# Patient Record
Sex: Female | Born: 1955 | Race: Black or African American | Hispanic: No | Marital: Single | Smoking: Current some day smoker
Health system: Southern US, Community
[De-identification: ages and names within clinical notes are randomized; demographics above are authoritative.]

## PROBLEM LIST (undated history)

## (undated) DIAGNOSIS — K589 Irritable bowel syndrome without diarrhea: Secondary | ICD-10-CM

## (undated) DIAGNOSIS — M1990 Unspecified osteoarthritis, unspecified site: Secondary | ICD-10-CM

---

## 2008-10-06 ENCOUNTER — Emergency Department (HOSPITAL_COMMUNITY): Admission: EM | Admit: 2008-10-06 | Discharge: 2008-10-06 | Payer: Self-pay | Admitting: Emergency Medicine

## 2014-05-26 ENCOUNTER — Emergency Department: Payer: Self-pay | Admitting: Emergency Medicine

## 2014-09-13 ENCOUNTER — Telehealth: Payer: Self-pay | Admitting: Gastroenterology

## 2014-09-13 NOTE — Telephone Encounter (Signed)
Received referral for IBS called pt to sch. She said her pcp gave her medication to see if it will help. She will call us if it doesn't work to sch.

## 2015-07-19 ENCOUNTER — Ambulatory Visit
Admission: RE | Admit: 2015-07-19 | Discharge: 2015-07-19 | Disposition: A | Discharge status: Home / Self Care | Payer: Disability Insurance | Source: Ambulatory Visit | Attending: Family Medicine | Admitting: Family Medicine

## 2015-07-19 ENCOUNTER — Other Ambulatory Visit: Payer: Self-pay | Admitting: Family Medicine

## 2015-07-19 DIAGNOSIS — M1990 Unspecified osteoarthritis, unspecified site: Secondary | ICD-10-CM

## 2015-07-19 DIAGNOSIS — M2578 Osteophyte, vertebrae: Secondary | ICD-10-CM | POA: Insufficient documentation

## 2015-07-19 DIAGNOSIS — M5136 Other intervertebral disc degeneration, lumbar region: Secondary | ICD-10-CM | POA: Insufficient documentation

## 2015-07-19 DIAGNOSIS — M25761 Osteophyte, right knee: Secondary | ICD-10-CM | POA: Diagnosis not present

## 2015-07-19 DIAGNOSIS — M545 Low back pain: Secondary | ICD-10-CM

## 2015-07-19 DIAGNOSIS — M25762 Osteophyte, left knee: Secondary | ICD-10-CM | POA: Insufficient documentation

## 2015-07-19 DIAGNOSIS — G8929 Other chronic pain: Secondary | ICD-10-CM | POA: Insufficient documentation

## 2015-07-19 DIAGNOSIS — M170 Bilateral primary osteoarthritis of knee: Secondary | ICD-10-CM | POA: Diagnosis not present

## 2016-04-21 ENCOUNTER — Other Ambulatory Visit: Payer: Self-pay | Admitting: Family Medicine

## 2016-04-21 DIAGNOSIS — Z1239 Encounter for other screening for malignant neoplasm of breast: Secondary | ICD-10-CM

## 2016-05-06 ENCOUNTER — Emergency Department
Admission: EM | Admit: 2016-05-06 | Discharge: 2016-05-06 | Disposition: A | Discharge status: Home / Self Care | Payer: Medicaid Other | Attending: Emergency Medicine | Admitting: Emergency Medicine

## 2016-05-06 ENCOUNTER — Encounter: Payer: Self-pay | Admitting: Emergency Medicine

## 2016-05-06 ENCOUNTER — Emergency Department: Payer: Medicaid Other

## 2016-05-06 DIAGNOSIS — R109 Unspecified abdominal pain: Secondary | ICD-10-CM | POA: Diagnosis present

## 2016-05-06 DIAGNOSIS — N390 Urinary tract infection, site not specified: Secondary | ICD-10-CM | POA: Insufficient documentation

## 2016-05-06 DIAGNOSIS — F17200 Nicotine dependence, unspecified, uncomplicated: Secondary | ICD-10-CM | POA: Insufficient documentation

## 2016-05-06 HISTORY — DX: Unspecified osteoarthritis, unspecified site: M19.90

## 2016-05-06 LAB — BASIC METABOLIC PANEL
Anion gap: 9 (ref 5–15)
BUN: 6 mg/dL (ref 6–20)
CHLORIDE: 105 mmol/L (ref 101–111)
CO2: 23 mmol/L (ref 22–32)
CREATININE: 0.88 mg/dL (ref 0.44–1.00)
Calcium: 8.9 mg/dL (ref 8.9–10.3)
GFR calc Af Amer: >60 mL/min (ref >60)
GFR calc non Af Amer: >60 mL/min (ref >60)
GLUCOSE: 119 mg/dL — AB (ref 65–99)
POTASSIUM: 3.8 mmol/L (ref 3.5–5.1)
SODIUM: 137 mmol/L (ref 135–145)

## 2016-05-06 LAB — URINALYSIS, COMPLETE (UACMP) WITH MICROSCOPIC
Bilirubin Urine: NEGATIVE
GLUCOSE, UA: NEGATIVE mg/dL
KETONES UR: NEGATIVE mg/dL
Nitrite: NEGATIVE
Protein, ur: 30 mg/dL — AB
Specific Gravity, Urine: 1.016 (ref 1.005–1.030)
pH: 5 (ref 5.0–8.0)

## 2016-05-06 LAB — CBC
HCT: 39.4 % (ref 35.0–47.0)
HEMOGLOBIN: 13.4 g/dL (ref 12.0–16.0)
MCH: 29.8 pg (ref 26.0–34.0)
MCHC: 34.1 g/dL (ref 32.0–36.0)
MCV: 87.3 fL (ref 80.0–100.0)
Platelets: 182 10*3/uL (ref 150–440)
RBC: 4.52 MIL/uL (ref 3.80–5.20)
RDW: 13.9 % (ref 11.5–14.5)
WBC: 5.1 10*3/uL (ref 3.6–11.0)

## 2016-05-06 MED ORDER — KETOROLAC TROMETHAMINE 60 MG/2ML IM SOLN
60.0000 mg | Freq: Once | INTRAMUSCULAR | Status: AC
  Administered 2016-05-06: 60 mg via INTRAMUSCULAR

## 2016-05-06 MED ORDER — OXYCODONE-ACETAMINOPHEN 5-325 MG PO TABS
1.0000 | ORAL_TABLET | Freq: Once | ORAL | Status: AC
  Administered 2016-05-06: 1 via ORAL

## 2016-05-06 MED ORDER — KETOROLAC TROMETHAMINE 60 MG/2ML IM SOLN
INTRAMUSCULAR | Status: AC
  Administered 2016-05-06: 60 mg via INTRAMUSCULAR
  Filled 2016-05-06: qty 2

## 2016-05-06 MED ORDER — OXYCODONE-ACETAMINOPHEN 5-325 MG PO TABS
ORAL_TABLET | ORAL | Status: AC
  Filled 2016-05-06: qty 1

## 2016-05-06 MED ORDER — CEPHALEXIN 500 MG PO CAPS: 500.0000 mg | ORAL_CAPSULE | Freq: Two times a day (BID) | ORAL | 0 refills | Status: DC

## 2016-05-06 NOTE — ED Provider Notes (Addendum)
[facility] Emergency Department Provider Note  ____________________________________________   First MD Initiated Contact with Patient 05/06/16 [phone]     (approximate)  I have reviewed the triage vital signs and the nursing notes.   HISTORY  Chief Complaint Flank Pain    HPI Alexis Blackwell is a 61 y.o. female with no significant chronic medical history and no history of kidney stones who presents for evaluationof acute onset severe sharp pain in her left flank radiating into her back and around to her abdomen.  She was upstairs in the hospital with her daughter who just given birth she sat down and one of the recliner is not felt the acute onset of pain.  She states that it changes with position but nothing in particular makes it better or worse.  It was severe initially but now it is mild.  There is also a cramping sensation associated with it.  She denies fever/chills, nausea, vomiting, chest pain, shortness of breath, lower abdominal pain, dysuria.  She denies any recent blood in her urine or increased urinary frequency.   Past Medical History:  Diagnosis Date  . Arthritis     There are no active problems to display for this patient.   History reviewed. No pertinent surgical history.  Prior to Admission medications   Medication Sig Start Date End Date Taking? Authorizing Provider  cephALEXin (KEFLEX) 500 MG capsule Take 1 capsule (500 mg total) by mouth 2 (two) times daily. 05/06/16   Loleta Rose, MD    Allergies Patient has no known allergies.  No family history on file.  Social History Social History  Substance Use Topics  . Smoking status: Current Some Day Smoker  . Smokeless tobacco: Never Used  . Alcohol use No    Review of Systems Constitutional: No fever/chills Eyes: No visual changes. ENT: No sore throat. Cardiovascular: Denies chest pain. Respiratory: Denies shortness of breath. Gastrointestinal: No abdominal pain.   No nausea, no vomiting.  No diarrhea.  No constipation. Genitourinary: Negative for dysuria. Musculoskeletal: Left flank pain radiating around to the front and back Skin: Negative for rash. Neurological: Negative for headaches, focal weakness or numbness.  10-point ROS otherwise negative.  ____________________________________________   PHYSICAL EXAM:  VITAL SIGNS: ED Triage Vitals  Enc Vitals Group     BP 05/06/16 0258 126/76     Pulse Rate 05/06/16 0258 88     Resp 05/06/16 0258 20     Temp 05/06/16 0258 98.5 F (36.9 C)     Temp Source 05/06/16 0258 Oral     SpO2 05/06/16 0258 97 %     Weight 05/06/16 0300 170 lb (77.1 kg)     Height 05/06/16 0300 4\' 11"  (1.499 m)     Head Circumference --      Peak Flow --      Pain Score 05/06/16 0258 9     Pain Loc --      Pain Edu? --      Excl. in GC? --     Constitutional: Alert and oriented. Well appearing and in no acute distress. Eyes: Conjunctivae are normal. PERRL. EOMI. Head: Atraumatic. Nose: No congestion/rhinnorhea. Mouth/Throat: Mucous membranes are moist.  Oropharynx non-erythematous. Neck: No stridor.  No meningeal signs.   Cardiovascular: Normal rate, regular rhythm. Good peripheral circulation. Grossly normal heart sounds. Respiratory: Normal respiratory effort.  No retractions. Lungs CTAB. Gastrointestinal: Soft and nontender. No distention.  Musculoskeletal: No lower extremity tenderness nor edema. No gross deformities of  extremities.The patient complains of cramping in her abdomen when she sits up and she complains of bilateral CVA tenderness to palpation. Neurologic:  Normal speech and language. No gross focal neurologic deficits are appreciated.  Skin:  Skin is warm, dry and intact. No rash noted. Psychiatric: Mood and affect are normal. Speech and behavior are normal.  ____________________________________________   LABS (all labs ordered are listed, but only abnormal results are displayed)  Labs Reviewed   BASIC METABOLIC PANEL - Abnormal; Notable for the following:       Result Value   Glucose, Bld 119 (*)    All other components within normal limits  URINALYSIS, COMPLETE (UACMP) WITH MICROSCOPIC - Abnormal; Notable for the following:    Color, Urine YELLOW (*)    APPearance HAZY (*)    Hgb urine dipstick MODERATE (*)    Protein, ur 30 (*)    Leukocytes, UA MODERATE (*)    Bacteria, UA RARE (*)    Squamous Epithelial / LPF 6-30 (*)    All other components within normal limits  URINE CULTURE  CBC   ____________________________________________  EKG  ED ECG REPORT I, [place] Dube, the attending physician, personally viewed and interpreted this ECG.  Date: 05/06/2016 EKG Time: 07:29 Rate: 78 Rhythm: normal sinus rhythm QRS Axis: normal Intervals: normal ST/T Wave abnormalities: normal Conduction Disturbances: none Narrative Interpretation: unremarkable  ____________________________________________  RADIOLOGY   No results found.  ____________________________________________   PROCEDURES  Procedure(s) performed:   Procedures   Critical Care performed: No ____________________________________________   INITIAL IMPRESSION / ASSESSMENT AND PLAN / ED COURSE  Pertinent labs & imaging results that were available during my care of the patient were reviewed by me and considered in my medical decision making (see chart for details).  The patient is not having any chest pain or shortness of breath and her signs and symptoms suggest either a kidney stone or some musculoskeletal pain.  She states the pain started when she sat down in a hospital recliner so it may very well be muscle strain particularly since she describes the pain now is mostly cramping.  However given that she has what appears to be a urinary tract infection I will check a CT renal stone protocol to make sure she does not have an infected stone.  Smoking is her only known cardiac risk factor and she has no  chest pain nor shortness of breath which is reassuring.  EKG is normal.  Transferring ED care to Dr. Mayford Knifewilliams to follow up EKG and CT scan.      ____________________________________________  FINAL CLINICAL IMPRESSION(S) / ED DIAGNOSES  Final diagnoses:  Left flank pain  Urinary tract infection without hematuria, site unspecified     MEDICATIONS GIVEN DURING THIS VISIT:  Medications  oxyCODONE-acetaminophen (PERCOCET/ROXICET) 5-325 MG per tablet (not administered)  oxyCODONE-acetaminophen (PERCOCET/ROXICET) 5-325 MG per tablet 1 tablet (1 tablet Oral Given 05/06/16 0534)     NEW OUTPATIENT MEDICATIONS STARTED DURING THIS VISIT:  New Prescriptions   CEPHALEXIN (KEFLEX) 500 MG CAPSULE    Take 1 capsule (500 mg total) by mouth 2 (two) times daily.    Modified Medications   No medications on file    Discontinued Medications   No medications on file     Note:  This document was prepared using Dragon voice recognition software and may include unintentional dictation errors.    Loleta Roseory Kamaron Deskins, MD 05/06/16 [id number]    Loleta Roseory Selma Rodelo, MD 05/06/16 [phone]

## 2016-05-06 NOTE — ED Triage Notes (Signed)
Pt to triage via w/c with no distress noted; reports onset left flank pain radiating into side tonight with no accomp symptoms; denies hx of same

## 2016-05-06 NOTE — ED Provider Notes (Signed)
CT scan is negative, she'll be discharged as previously prepared by Dr. Joelene MillinForbach   Jonathan E Williams, MD 05/06/16 [phone]

## 2016-05-07 LAB — URINE CULTURE: SPECIAL REQUESTS: NORMAL

## 2016-05-22 ENCOUNTER — Ambulatory Visit
Admission: RE | Admit: 2016-05-22 | Discharge: 2016-05-22 | Disposition: A | Discharge status: Home / Self Care | Payer: Medicaid Other | Source: Ambulatory Visit | Attending: Family Medicine | Admitting: Family Medicine

## 2016-05-22 DIAGNOSIS — Z1239 Encounter for other screening for malignant neoplasm of breast: Secondary | ICD-10-CM

## 2016-05-22 DIAGNOSIS — Z1231 Encounter for screening mammogram for malignant neoplasm of breast: Secondary | ICD-10-CM | POA: Diagnosis present

## 2016-05-30 ENCOUNTER — Emergency Department
Admission: EM | Admit: 2016-05-30 | Discharge: 2016-05-30 | Disposition: A | Discharge status: Home / Self Care | Payer: Medicaid Other | Attending: Emergency Medicine | Admitting: Emergency Medicine

## 2016-05-30 ENCOUNTER — Encounter: Payer: Self-pay | Admitting: Emergency Medicine

## 2016-05-30 DIAGNOSIS — Z792 Long term (current) use of antibiotics: Secondary | ICD-10-CM | POA: Insufficient documentation

## 2016-05-30 DIAGNOSIS — F17210 Nicotine dependence, cigarettes, uncomplicated: Secondary | ICD-10-CM | POA: Diagnosis not present

## 2016-05-30 DIAGNOSIS — R197 Diarrhea, unspecified: Secondary | ICD-10-CM | POA: Insufficient documentation

## 2016-05-30 HISTORY — DX: Irritable bowel syndrome without diarrhea: K58.9

## 2016-05-30 LAB — URINALYSIS, COMPLETE (UACMP) WITH MICROSCOPIC
BACTERIA UA: NONE SEEN
BILIRUBIN URINE: NEGATIVE
Glucose, UA: NEGATIVE mg/dL
Ketones, ur: NEGATIVE mg/dL
Leukocytes, UA: NEGATIVE
Nitrite: NEGATIVE
PROTEIN: NEGATIVE mg/dL
RBC / HPF: NONE SEEN RBC/hpf (ref 0–5)
SPECIFIC GRAVITY, URINE: 1.002 — AB (ref 1.005–1.030)
Squamous Epithelial / LPF: NONE SEEN
pH: 6 (ref 5.0–8.0)

## 2016-05-30 LAB — COMPREHENSIVE METABOLIC PANEL
ALBUMIN: 3.8 g/dL (ref 3.5–5.0)
ALT: 19 U/L (ref 14–54)
ANION GAP: 8 (ref 5–15)
AST: 24 U/L (ref 15–41)
Alkaline Phosphatase: 72 U/L (ref 38–126)
BUN: 7 mg/dL (ref 6–20)
CALCIUM: 8.6 mg/dL — AB (ref 8.9–10.3)
CHLORIDE: 111 mmol/L (ref 101–111)
CO2: 21 mmol/L — AB (ref 22–32)
Creatinine, Ser: 0.94 mg/dL (ref 0.44–1.00)
GFR calc non Af Amer: >60 mL/min (ref >60)
Glucose, Bld: 107 mg/dL — ABNORMAL HIGH (ref 65–99)
POTASSIUM: 3.6 mmol/L (ref 3.5–5.1)
SODIUM: 140 mmol/L (ref 135–145)
Total Bilirubin: 0.6 mg/dL (ref 0.3–1.2)
Total Protein: 7.2 g/dL (ref 6.5–8.1)

## 2016-05-30 LAB — CBC
HEMATOCRIT: 41.1 % (ref 35.0–47.0)
HEMOGLOBIN: 13.4 g/dL (ref 12.0–16.0)
MCH: 28.6 pg (ref 26.0–34.0)
MCHC: 32.7 g/dL (ref 32.0–36.0)
MCV: 87.6 fL (ref 80.0–100.0)
Platelets: 204 10*3/uL (ref 150–440)
RBC: 4.7 MIL/uL (ref 3.80–5.20)
RDW: 14.1 % (ref 11.5–14.5)
WBC: 4.8 10*3/uL (ref 3.6–11.0)

## 2016-05-30 LAB — LIPASE, BLOOD: LIPASE: 35 U/L (ref 11–51)

## 2016-05-30 MED ORDER — DICYCLOMINE HCL 20 MG PO TABS: 20.0000 mg | ORAL_TABLET | Freq: Three times a day (TID) | ORAL | 0 refills | Status: DC | PRN

## 2016-05-30 MED ORDER — SUCRALFATE 1 G PO TABS: 1.0000 g | ORAL_TABLET | Freq: Four times a day (QID) | ORAL | 0 refills | Status: DC

## 2016-05-30 NOTE — ED Provider Notes (Signed)
[facility] Emergency Department Provider Note  ____________________________________________   I have reviewed the triage vital signs and the nursing notes.   HISTORY  Chief Complaint Diarrhea   History limited by: Not Limited   HPI Alexis Blackwell is a 61 y.o. female who presents to the emergency department today because of concerns for chronic diarrhea. She states this has been going on for years. She states it happens anytime she eats. This has been accompanied by burning at the anus. The patient has an appointment scheduled with a GI doctor later this month. She is also seen her primary care doctor for this issue. Has had some associated abdominal discomfort. Has not had any nausea or vomiting.    Past Medical History:  Diagnosis Date  . Arthritis   . Irritable bowel syndrome (IBS)     There are no active problems to display for this patient.   History reviewed. No pertinent surgical history.  Prior to Admission medications   Medication Sig Start Date End Date Taking? Authorizing Provider  cephALEXin (KEFLEX) 500 MG capsule Take 1 capsule (500 mg total) by mouth 2 (two) times daily. 05/06/16   Loleta Rose, MD    Allergies Patient has no known allergies.  No family history on file.  Social History Social History  Substance Use Topics  . Smoking status: Current Some Day Smoker    Types: Cigarettes  . Smokeless tobacco: Never Used  . Alcohol use No    Review of Systems  Constitutional: Negative for fever. Cardiovascular: Negative for chest pain. Respiratory: Negative for shortness of breath. Gastrointestinal: Positive for diarrhea.  Genitourinary: Negative for dysuria. Musculoskeletal: Negative for back pain. Skin: Negative for rash. Neurological: Negative for headaches, focal weakness or numbness.  10-point ROS otherwise negative.  ____________________________________________   PHYSICAL EXAM:  VITAL SIGNS: ED Triage  Vitals  Enc Vitals Group     BP 05/30/16 1349 (!) 118/54     Pulse Rate 05/30/16 1349 87     Resp 05/30/16 1349 20     Temp 05/30/16 1349 98.2 F (36.8 C)     Temp Source 05/30/16 1349 Oral     SpO2 05/30/16 1349 98 %     Weight 05/30/16 1350 172 lb (78 kg)     Height 05/30/16 1350 4\' 11"  (1.499 m)     Head Circumference --      Peak Flow --      Pain Score 05/30/16 1359 10   Constitutional: Alert and oriented. Well appearing and in no distress. Eyes: Conjunctivae are normal. Normal extraocular movements. ENT   Head: Normocephalic and atraumatic.   Nose: No congestion/rhinnorhea.   Mouth/Throat: Mucous membranes are moist.   Neck: No stridor. Hematological/Lymphatic/Immunilogical: No cervical lymphadenopathy. Cardiovascular: Normal rate, regular rhythm.  No murmurs, rubs, or gallops.  Respiratory: Normal respiratory effort without tachypnea nor retractions. Breath sounds are clear and equal bilaterally. No wheezes/rales/rhonchi. Gastrointestinal: Soft and non tender. No rebound. No guarding.  Genitourinary: Deferred Musculoskeletal: Normal range of motion in all extremities. No lower extremity edema. Neurologic:  Normal speech and language. No gross focal neurologic deficits are appreciated.  Skin:  Skin is warm, dry and intact. No rash noted. Psychiatric: Mood and affect are normal. Speech and behavior are normal. Patient exhibits appropriate insight and judgment.  ____________________________________________    LABS (pertinent positives/negatives)  Labs Reviewed  COMPREHENSIVE METABOLIC PANEL - Abnormal; Notable for the following:       Result Value   CO2 21 (*)  Glucose, Bld 107 (*)    Calcium 8.6 (*)    All other components within normal limits  URINALYSIS, COMPLETE (UACMP) WITH MICROSCOPIC - Abnormal; Notable for the following:    Color, Urine COLORLESS (*)    APPearance CLEAR (*)    Specific Gravity, Urine 1.002 (*)    Hgb urine dipstick SMALL (*)     All other components within normal limits  LIPASE, BLOOD  CBC     ____________________________________________   EKG  None  ____________________________________________    RADIOLOGY  None  ____________________________________________   PROCEDURES  Procedures  ____________________________________________   INITIAL IMPRESSION / ASSESSMENT AND PLAN / ED COURSE  Pertinent labs & imaging results that were available during my care of the patient were reviewed by me and considered in my medical decision making (see chart for details).  Patient presented to the emergency department today because of concerns for chronic diarrhea. Blood work and urine without concerning findings. At this point patient has an appointment already scheduled with GI doctor. At this point given lack of abdominal tenderness or concerning findings on blood work I do not think an emergent abdominal imaging is required. Additionally she did have a CT renal stone done less than one month ago without any concerning findings. Will have patient follow-up with GI doctor. Will try Bentyl and sucralfate C footholds alleviate some of the patient's symptoms.  ____________________________________________   FINAL CLINICAL IMPRESSION(S) / ED DIAGNOSES  Final diagnoses:  Diarrhea, unspecified type     Note: This dictation was prepared with Dragon dictation. Any transcriptional errors that result from this process are unintentional     Phineas SemenGraydon Xzavion Doswell, MD 05/30/16 1513

## 2016-05-30 NOTE — ED Notes (Signed)
Pt states the only time she has diarrhea is right after she eats and she is requesting to eat while waiting out in the lobby. Pt also wanting to go outside to smoke and understands that this is a  Non-smoking facility and that if she goes outside we might call her for a room.  Pt back in lobby and is waiting for a room. Request made for pt to go to cpod.

## 2016-05-30 NOTE — ED Triage Notes (Addendum)
Pt complains of loose stools for last couple of weeks. Pt denies nausea and vomiting. Pt complains of abdominal pain. Pt states she is eating and drinking. Pt has been on antibiotics recently for an UTI.

## 2016-05-30 NOTE — Discharge Instructions (Signed)
Please seek medical attention for any high fevers, chest pain, shortness of breath, change in behavior, persistent vomiting, bloody stool or any other new or concerning symptoms.  

## 2016-06-23 ENCOUNTER — Other Ambulatory Visit
Admission: RE | Admit: 2016-06-23 | Discharge: 2016-06-23 | Disposition: A | Discharge status: Home / Self Care | Payer: Medicaid Other | Source: Ambulatory Visit | Attending: Nurse Practitioner | Admitting: Nurse Practitioner

## 2016-06-23 DIAGNOSIS — R197 Diarrhea, unspecified: Secondary | ICD-10-CM | POA: Diagnosis not present

## 2016-06-23 LAB — GASTROINTESTINAL PANEL BY PCR, STOOL (REPLACES STOOL CULTURE)

## 2016-06-23 LAB — C DIFFICILE QUICK SCREEN W PCR REFLEX
C Diff antigen: NEGATIVE
C Diff interpretation: NOT DETECTED
C Diff toxin: NEGATIVE

## 2016-08-17 ENCOUNTER — Emergency Department: Payer: Medicaid Other

## 2016-08-17 ENCOUNTER — Encounter: Payer: Self-pay | Admitting: Emergency Medicine

## 2016-08-17 ENCOUNTER — Emergency Department
Admission: EM | Admit: 2016-08-17 | Discharge: 2016-08-17 | Disposition: A | Discharge status: Home / Self Care | Payer: Medicaid Other | Attending: Emergency Medicine | Admitting: Emergency Medicine

## 2016-08-17 DIAGNOSIS — J069 Acute upper respiratory infection, unspecified: Secondary | ICD-10-CM

## 2016-08-17 DIAGNOSIS — F17210 Nicotine dependence, cigarettes, uncomplicated: Secondary | ICD-10-CM | POA: Diagnosis not present

## 2016-08-17 DIAGNOSIS — J209 Acute bronchitis, unspecified: Secondary | ICD-10-CM | POA: Diagnosis not present

## 2016-08-17 DIAGNOSIS — J40 Bronchitis, not specified as acute or chronic: Secondary | ICD-10-CM

## 2016-08-17 DIAGNOSIS — Z720 Tobacco use: Secondary | ICD-10-CM

## 2016-08-17 DIAGNOSIS — R05 Cough: Secondary | ICD-10-CM | POA: Diagnosis present

## 2016-08-17 MED ORDER — AZITHROMYCIN 250 MG PO TABS: ORAL_TABLET | ORAL | 0 refills | Status: AC

## 2016-08-17 MED ORDER — ALBUTEROL SULFATE (2.5 MG/3ML) 0.083% IN NEBU
5.0000 mg | INHALATION_SOLUTION | Freq: Once | RESPIRATORY_TRACT | Status: AC
  Administered 2016-08-17: 5 mg via RESPIRATORY_TRACT
  Filled 2016-08-17: qty 6

## 2016-08-17 MED ORDER — ALBUTEROL SULFATE HFA 108 (90 BASE) MCG/ACT IN AERS: 2.0000 | INHALATION_SPRAY | Freq: Four times a day (QID) | RESPIRATORY_TRACT | 2 refills | Status: AC | PRN

## 2016-08-17 MED ORDER — DEXAMETHASONE SODIUM PHOSPHATE 10 MG/ML IJ SOLN
10.0000 mg | Freq: Once | INTRAMUSCULAR | Status: AC
  Administered 2016-08-17: 10 mg via INTRAMUSCULAR
  Filled 2016-08-17: qty 1

## 2016-08-17 NOTE — ED Notes (Signed)
When placing pt on monitor, pt stated "why are you doing that", explained to pt that we want to monitor her while in the ED; pt states "well I want you to take it off, and I want you to feed me too"

## 2016-08-17 NOTE — ED Notes (Signed)
Patient to stat desk via wheelchair by EMS.  Reports patient here to be seen for cough and sore throat for 1 week.  HR - 100; BP - 110/70; Pulse ox 100% on room air.

## 2016-08-17 NOTE — ED Triage Notes (Addendum)
Pt reports feeling bad since the beginning of the week with headache, sinus drainage, productive cough of white sputum and bilateral earaches; pt says sometimes she coughs until she's incontinent of urine and stool; pt talking in complete coherent sentences in triage; pt having coughing spells in triage; lungs clear

## 2016-08-17 NOTE — ED Provider Notes (Addendum)
[facility] Emergency Department Provider Note  ____________________________________________   I have reviewed the triage vital signs and the nursing notes.   HISTORY  Chief Complaint Cough; Headache; Otalgia; and Sore Throat    HPI Alexis Blackwell is a 61 y.o. female with a history of chronic irritable bowel syndrome diarrhea and tobacco abuse with prior history of bronchitis presents today with a cough which is usually productive. He has been getting worse over the last week he also complains of sinus pain and pressure, mild sore throat, no definite fever, although felt warm earlier in the week.. She does have a history of stress incontinence and states it's worse when she coughs. No numbness or weakness. She denies any vomiting or abdominal pain. She states that the cough sometimes productive. She does continue to smoke through this illness   Past Medical History:  Diagnosis Date  . Arthritis   . Irritable bowel syndrome (IBS)     There are no active problems to display for this patient.   History reviewed. No pertinent surgical history.  Prior to Admission medications   Not on File    Allergies Patient has no known allergies.  History reviewed. No pertinent family history.  Social History Social History  Substance Use Topics  . Smoking status: Current Some Day Smoker    Types: Cigarettes  . Smokeless tobacco: Never Used  . Alcohol use No    Review of Systems Constitutional: No fever/chills Eyes: No visual changes. ENT: No sore throat. No stiff neck no neck pain Cardiovascular: Denies chest pain. Respiratory: Positive cough negative for significant shortness of breath. Gastrointestinal:   no vomiting.  Positive chronic diarrhea.  No constipation. Genitourinary: Negative for dysuria. Musculoskeletal: Negative lower extremity swelling Skin: Negative for rash. Neurological: Negative for severe headaches, focal weakness or  numbness.   ____________________________________________   PHYSICAL EXAM:  VITAL SIGNS: ED Triage Vitals  Enc Vitals Group     BP 08/17/16 0503 107/60     Pulse Rate 08/17/16 0503 (!) 108     Resp 08/17/16 0503 20     Temp 08/17/16 0503 99.2 F (37.3 C)     Temp Source 08/17/16 0503 Oral     SpO2 08/17/16 0503 98 %     Weight 08/17/16 0501 180 lb (81.6 kg)     Height 08/17/16 0501 4\' 11"  (1.499 m)     Head Circumference --      Peak Flow --      Pain Score --      Pain Loc --      Pain Edu? --      Excl. in GC? --     Constitutional: Alert and oriented. Well appearing and in no acute distress.Occasional hacking cough appreciated Eyes: Conjunctivae are normal. PERRL. EOMI. Head: Atraumatic. Nose: Positive clear congestion/rhinnorhea. Mouth/Throat: Mucous membranes are moist.  Oropharynx non-erythematous. TMs show no evidence of otitis media, there is mild retraction Neck: No stridor.   Nontender with no meningismus Cardiovascular: Normal rate, regular rhythm. Grossly normal heart sounds.  Good peripheral circulation. Respiratory: Normal respiratory effort.  No retractions. Lungs show occasional mild very faint wheeze. No rales or rhonchi Abdominal: Soft and nontender. No distention. No guarding no rebound Back:  There is no focal tenderness or step off.  there is no midline tenderness there are no lesions noted. there is no CVA tenderness Musculoskeletal: No lower extremity tenderness, no upper extremity tenderness. No joint effusions, no DVT signs strong distal pulses no  edema Neurologic:  Normal speech and language. No gross focal neurologic deficits are appreciated.  Skin:  Skin is warm, dry and intact. No rash noted. Psychiatric: Mood and affect are normal. Speech and behavior are normal.  ____________________________________________   LABS (all labs ordered are listed, but only abnormal results are displayed)  Labs Reviewed - No data to  display ____________________________________________  EKG  I personally interpreted any EKGs ordered by me or triage  ____________________________________________  RADIOLOGY  I reviewed any imaging ordered by me or triage that were performed during my shift and, if possible, patient and/or family made aware of any abnormal findings. ____________________________________________   PROCEDURES  Procedure(s) performed: None  Procedures  Critical Care performed: None  ____________________________________________   INITIAL IMPRESSION / ASSESSMENT AND PLAN / ED COURSE  Pertinent labs & imaging results that were available during my care of the patient were reviewed by me and considered in my medical decision making (see chart for details).  Smoker with URI symptoms. Heart rate in this time is in the 90s. Giving her albuterol and Decadron to see if  we can calm down her cough. Spent 7 minutes discussing smoking cessation.  Patient has symptoms and symptoms of bronchitis given the symptoms from week ongoing tobacco abuse and mild tachycardia on arrival and think perhaps it might be in her best interest to have atypical coverage. Chest x-ray is reassuring however. We will reassess after albuterol. Patient is nontoxic in appearance, she is hungry, extensive return precautions upon given and understood. Nothing at this time to suggest ACS PE dissection myocarditis endocarditis pericarditis pneumonia or pneumothorax. No orders are likely evidence of bacterial sinusitis. This only could be a viral process but given her significant smoking history, she may require atypical coverage.  ----------------------------------------- 8:41 AM on 08/17/2016 -----------------------------------------  This is breathing is actually better wheezes gone, she still has a cough which she would like not to have explained to her that that'll take time to go away. We have given her steroids and epigastric exam  she'll continue to feel better. Her heart rate right now is 98 after albuterol. She satting 97% on room air. Discussed admission patient would very much prefer to go home. She understands that if she feels worse at all she is to return for further evaluation. Patient declines blood work or further assessment at this time      ____________________________________________   FINAL CLINICAL IMPRESSION(S) / ED DIAGNOSES  Final diagnoses:  None      This chart was dictated using voice recognition software.  Despite best efforts to proofread,  errors can occur which can change meaning.      Jeanmarie PlantMcShane, Kawena Lyday A, MD 08/17/16 [id number]    Jeanmarie PlantMcShane, Harinder Romas A, MD 08/17/16 [phone]

## 2020-10-14 ENCOUNTER — Emergency Department (HOSPITAL_COMMUNITY)
Admission: EM | Admit: 2020-10-14 | Discharge: 2020-10-14 | Disposition: A | Discharge status: Home / Self Care | Payer: Self-pay | Attending: Emergency Medicine | Admitting: Emergency Medicine

## 2020-10-14 ENCOUNTER — Emergency Department (HOSPITAL_COMMUNITY): Payer: Self-pay

## 2020-10-14 ENCOUNTER — Encounter (HOSPITAL_COMMUNITY): Payer: Self-pay | Admitting: Emergency Medicine

## 2020-10-14 ENCOUNTER — Other Ambulatory Visit: Payer: Self-pay

## 2020-10-14 DIAGNOSIS — F17210 Nicotine dependence, cigarettes, uncomplicated: Secondary | ICD-10-CM | POA: Insufficient documentation

## 2020-10-14 DIAGNOSIS — M79642 Pain in left hand: Secondary | ICD-10-CM | POA: Insufficient documentation

## 2020-10-14 DIAGNOSIS — R202 Paresthesia of skin: Secondary | ICD-10-CM | POA: Insufficient documentation

## 2020-10-14 MED ORDER — NAPROXEN 500 MG PO TABS: 500.0000 mg | ORAL_TABLET | Freq: Two times a day (BID) | ORAL | 0 refills | Status: AC

## 2020-10-14 MED ORDER — LIDOCAINE 5 % EX PTCH: 1.0000 | MEDICATED_PATCH | CUTANEOUS | 0 refills | Status: DC

## 2020-10-14 MED ORDER — LIDOCAINE 5 % EX PTCH: 1.0000 | MEDICATED_PATCH | CUTANEOUS | 0 refills | Status: AC

## 2020-10-14 MED ORDER — NAPROXEN 500 MG PO TABS: 500.0000 mg | ORAL_TABLET | Freq: Two times a day (BID) | ORAL | 0 refills | Status: DC

## 2020-10-14 MED ORDER — OXYCODONE-ACETAMINOPHEN 5-325 MG PO TABS
1.0000 | ORAL_TABLET | Freq: Once | ORAL | Status: AC
  Administered 2020-10-14: 1 via ORAL
  Filled 2020-10-14: qty 1

## 2020-10-14 MED ORDER — OXYCODONE HCL 5 MG PO TABS: 5.0000 mg | ORAL_TABLET | Freq: Four times a day (QID) | ORAL | 0 refills | Status: DC | PRN

## 2020-10-14 MED ORDER — OXYCODONE HCL 5 MG PO TABS: 5.0000 mg | ORAL_TABLET | Freq: Four times a day (QID) | ORAL | 0 refills | Status: AC | PRN

## 2020-10-14 NOTE — ED Provider Notes (Signed)
[facility] EMERGENCY DEPARTMENT Provider Note   CSN: [id number] Arrival date & time: 10/14/20  0973    History Chief Complaint  Patient presents with  . Hand Problem    Alexis Blackwell is a 65 y.o. female with medical history significant for arthritis who comes in for left hand pain.  She has diffuse pain to her left hand however worse at digits 1 through 3.  She feels like her hand is falling asleep. No known history of carpal tunnel however does have known history of arthritis.  She has not noticed any redness, swelling to her joints.  No headache, vision changes, difficulty with speech, neck pain, neck stiffness. No recent chiropractor adjustment. Has not taken anything for symptoms. Rates her pain a 7/10. No known injuries. Denies additional aggravating or alleviating factors.  History obtained from patient and past medical records. No interpretor was used.  HPI     Past Medical History:  Diagnosis Date  . Arthritis   . Irritable bowel syndrome (IBS)     There are no problems to display for this patient.   No past surgical history on file.   OB History   No obstetric history on file.     No family history on file.  Social History   Tobacco Use  . Smoking status: Some Days    Types: Cigarettes  . Smokeless tobacco: Never  Substance Use Topics  . Alcohol use: No    Home Medications Prior to Admission medications   Medication Sig Start Date End Date Taking? Authorizing Provider  albuterol (PROVENTIL HFA;VENTOLIN HFA) 108 (90 Base) MCG/ACT inhaler Inhale 2 puffs into the lungs every 6 (six) hours as needed for wheezing or shortness of breath. 08/17/16   Jeanmarie Plant, MD  azithromycin (ZITHROMAX Z-PAK) 250 MG tablet Take 2 tablets (500 mg) on  Day 1,  followed by 1 tablet (250 mg) once daily on Days 2 through 5. 08/17/16   Jeanmarie Plant, MD  lidocaine (LIDODERM) 5 % Place 1 patch onto the skin daily. Remove & Discard patch within 12  hours or as directed by MD 10/14/20   Rajendra Spiller A, PA-C  naproxen (NAPROSYN) 500 MG tablet Take 1 tablet (500 mg total) by mouth 2 (two) times daily. 10/14/20   Devlyn Retter A, PA-C  oxyCODONE (ROXICODONE) 5 MG immediate release tablet Take 1 tablet (5 mg total) by mouth every 6 (six) hours as needed for up to 3 days for severe pain. 10/14/20 10/17/20  Avagail Whittlesey A, PA-C    Allergies    Patient has no known allergies.  Review of Systems   Review of Systems  Constitutional: Negative.   HENT: Negative.    Respiratory: Negative.    Cardiovascular: Negative.   Gastrointestinal: Negative.   Genitourinary: Negative.   Musculoskeletal: Negative.        Left hand pain  Skin: Negative.   Neurological:  Positive for numbness. Negative for dizziness, tremors, syncope, facial asymmetry, weakness, light-headedness and headaches.       Tingling to left hand  All other systems reviewed and are negative.  Physical Exam Updated Vital Signs BP 132/81 (BP Location: Right Arm)   Pulse 98   Temp 97.9 F (36.6 C) (Oral)   Resp 16   SpO2 96%   Physical Exam Vitals and nursing note reviewed.  Constitutional:      General: She is not in acute distress.    Appearance: She is well-developed. She is not  ill-appearing, toxic-appearing or diaphoretic.  HENT:     Head: Normocephalic and atraumatic.     Nose: Nose normal.     Mouth/Throat:     Mouth: Mucous membranes are moist.  Eyes:     Pupils: Pupils are equal, round, and reactive to light.  Cardiovascular:     Rate and Rhythm: Normal rate.     Pulses: Normal pulses.          Radial pulses are 2+ on the right side and 2+ on the left side.     Heart sounds: Normal heart sounds.  Pulmonary:     Effort: Pulmonary effort is normal. No respiratory distress.     Breath sounds: Normal breath sounds.  Abdominal:     General: Bowel sounds are normal. There is no distension.     Palpations: Abdomen is soft.  Musculoskeletal:         General: Normal range of motion.     Cervical back: Normal range of motion.     Comments: Diffuse tenderness to left hand. Able to flex, extend, pronate, supinate at BL hands. Equal hand grip bilaterally.  Nontender bilateral forearms.  No pain to palpation left breast, metacarpals, joint spaces. Positive tinel sign on left.  Skin:    General: Skin is warm and dry.     Comments: No edema, erythema ,warmth. No rashes or lesions  Neurological:     General: No focal deficit present.     Mental Status: She is alert.     Cranial Nerves: Cranial nerves are intact.     Sensory: Sensation is intact.     Comments: Equal hand grip bilaterally Intact sensation CN 2-12 grossly intact Ambulatory without ataxic gait  Psychiatric:        Mood and Affect: Mood normal.    ED Results / Procedures / Treatments   Labs (all labs ordered are listed, but only abnormal results are displayed) Labs Reviewed - No data to display  EKG None  Radiology DG Hand Complete Left  Result Date: 10/14/2020 CLINICAL DATA:  Hand pain and tingling for 1 week. EXAM: LEFT HAND - COMPLETE 3+ VIEW COMPARISON:  None. FINDINGS: There is no evidence of fracture or dislocation. Apparent mild osteopenia. Soft tissues are unremarkable. IMPRESSION: No acute fracture or dislocation identified about the left hand. Apparent osteopenia. Electronically Signed   By: Ted Mcalpine M.D.   On: 10/14/2020 10:22    Procedures .Splint Application  Date/Time: 10/14/2020 10:24 AM Performed by: Linwood Dibbles, PA-C Authorized by: Linwood Dibbles, PA-C   Consent:    Consent obtained:  Verbal   Consent given by:  Patient   Risks, benefits, and alternatives were discussed: yes     Risks discussed:  Discoloration, numbness, pain and swelling   Alternatives discussed:  No treatment, delayed treatment, alternative treatment, observation and referral Universal protocol:    Procedure explained and questions answered to patient or  proxy's satisfaction: yes     Relevant documents present and verified: yes     Test results available: yes     Imaging studies available: yes     Required blood products, implants, devices, and special equipment available: yes     Site/side marked: yes     Immediately prior to procedure a time out was called: yes     Patient identity confirmed:  Verbally with patient Pre-procedure details:    Pre-procedure CMS: Tingling to left digits 1-3.   Distal perfusion: distal pulses strong and brisk capillary  refill   Procedure details:    Location:  Wrist   Wrist location:  L wrist   Strapping: no     Cast type:  Short arm   Attestation: Splint applied and adjusted personally by me   Post-procedure details:    Distal neurologic exam:  Normal   Distal perfusion: distal pulses strong and brisk capillary refill     Procedure completion:  Tolerated well, no immediate complications   Post-procedure imaging: not applicable     Medications Ordered in ED Medications  oxyCODONE-acetaminophen (PERCOCET/ROXICET) 5-325 MG per tablet 1 tablet (1 tablet Oral Given 10/14/20 1021)    ED Course  I have reviewed the triage vital signs and the nursing notes.  Pertinent labs & imaging results that were available during my care of the patient were reviewed by me and considered in my medical decision making (see chart for details).  Here for evaluation of left hand pain and tingling over the last week.  No traumatic injuries.  She has a nonfocal neuro exam without deficits.  Equal strength bilaterally.  Diffuse tenderness to hand however no overlying skin changes to suggest infectious process.  Tingling worse at digits 1 through 3 at hand.  No other deficits, headache, facial droop, word finding.  Low suspicion for acute CVA.  She has no neck stiffness or neck rigidity.  No recent chiropractic adjustments.  Low suspicion for radicular symptoms. Positive tinel sign on left.  Xray personally reviewed and  interpreted:  Xray without fracture, dislocation. Does have osteopenia on xray.  Exam not consistent with septic joint, occult fracture, dislocation, septic arthritis, radiculopathy, acute intracranial abnormality.  Patient's placed in splint, given pain management, follow-up with neuro for official evaluation of possible carpal tunnel.  The patient has been appropriately medically screened and/or stabilized in the ED. I have low suspicion for any other emergent medical condition which would require further screening, evaluation or treatment in the ED or require inpatient management.  Patient is hemodynamically stable and in no acute distress.  Patient able to ambulate in department prior to ED.  Evaluation does not show acute pathology that would require ongoing or additional emergent interventions while in the emergency department or further inpatient treatment.  I have discussed the diagnosis with the patient and answered all questions.  Pain is been managed while in the emergency department and patient has no further complaints prior to discharge.  Patient is comfortable with plan discussed in room and is stable for discharge at this time.  I have discussed strict return precautions for returning to the emergency department.  Patient was encouraged to follow-up with PCP/specialist refer to at discharge.      MDM Rules/Calculators/A&P                           Final Clinical Impression(s) / ED Diagnoses Final diagnoses:  Pain of left hand    Rx / DC Orders ED Discharge Orders          Ordered    oxyCODONE (ROXICODONE) 5 MG immediate release tablet  Every 6 hours PRN,   Status:  Discontinued        10/14/20 1121    naproxen (NAPROSYN) 500 MG tablet  2 times daily,   Status:  Discontinued        10/14/20 1121    lidocaine (LIDODERM) 5 %  Every 24 hours,   Status:  Discontinued        10/14/20  1121    lidocaine (LIDODERM) 5 %  Every 24 hours        10/14/20 1122    naproxen  (NAPROSYN) 500 MG tablet  2 times daily        10/14/20 1122    oxyCODONE (ROXICODONE) 5 MG immediate release tablet  Every 6 hours PRN        10/14/20 1122             Malley Hauter A, PA-C 10/14/20 1247    Milagros Lollykstra, Richard S, MD 10/15/20 [phone]

## 2020-10-14 NOTE — ED Triage Notes (Signed)
Pt from home complaint of hand pain and tingling for 1 week. Pt states pain getting worse.

## 2020-10-14 NOTE — Discharge Instructions (Addendum)
Your exam is consistent with carpal tunnel.  I have placed you in a wrist splint.  I have written you for a short course of some pain medicine.  Take as prescribed.  Follow-up with primary care provider for possible nerve conduction testing.  Return for new or worsening symptoms.

## 2020-10-14 NOTE — ED Notes (Signed)
Pt ambulating to bathroom.

## 2020-10-14 NOTE — Progress Notes (Signed)
Orthopedic Tech Progress Note Patient Details:  Alexis Blackwell [birth year] [id number] Applied wrist brace to LUE. Ortho Devices Type of Ortho Device: Wrist splint Ortho Device/Splint Location: LUE Ortho Device/Splint Interventions: Ordered, Application   Post Interventions Patient Tolerated: Well Instructions Provided: Adjustment of device  Kerry Fort 10/14/2020, 10:43 AM

## 2023-03-05 IMAGING — DX DG HAND COMPLETE 3+V*L*
3 series · 3 of 3 positions shown · non-contrast
Comparison: None.

CLINICAL DATA: Hand pain and tingling for 1 week.

EXAM:
LEFT HAND - COMPLETE 3+ VIEW

[hand ap]
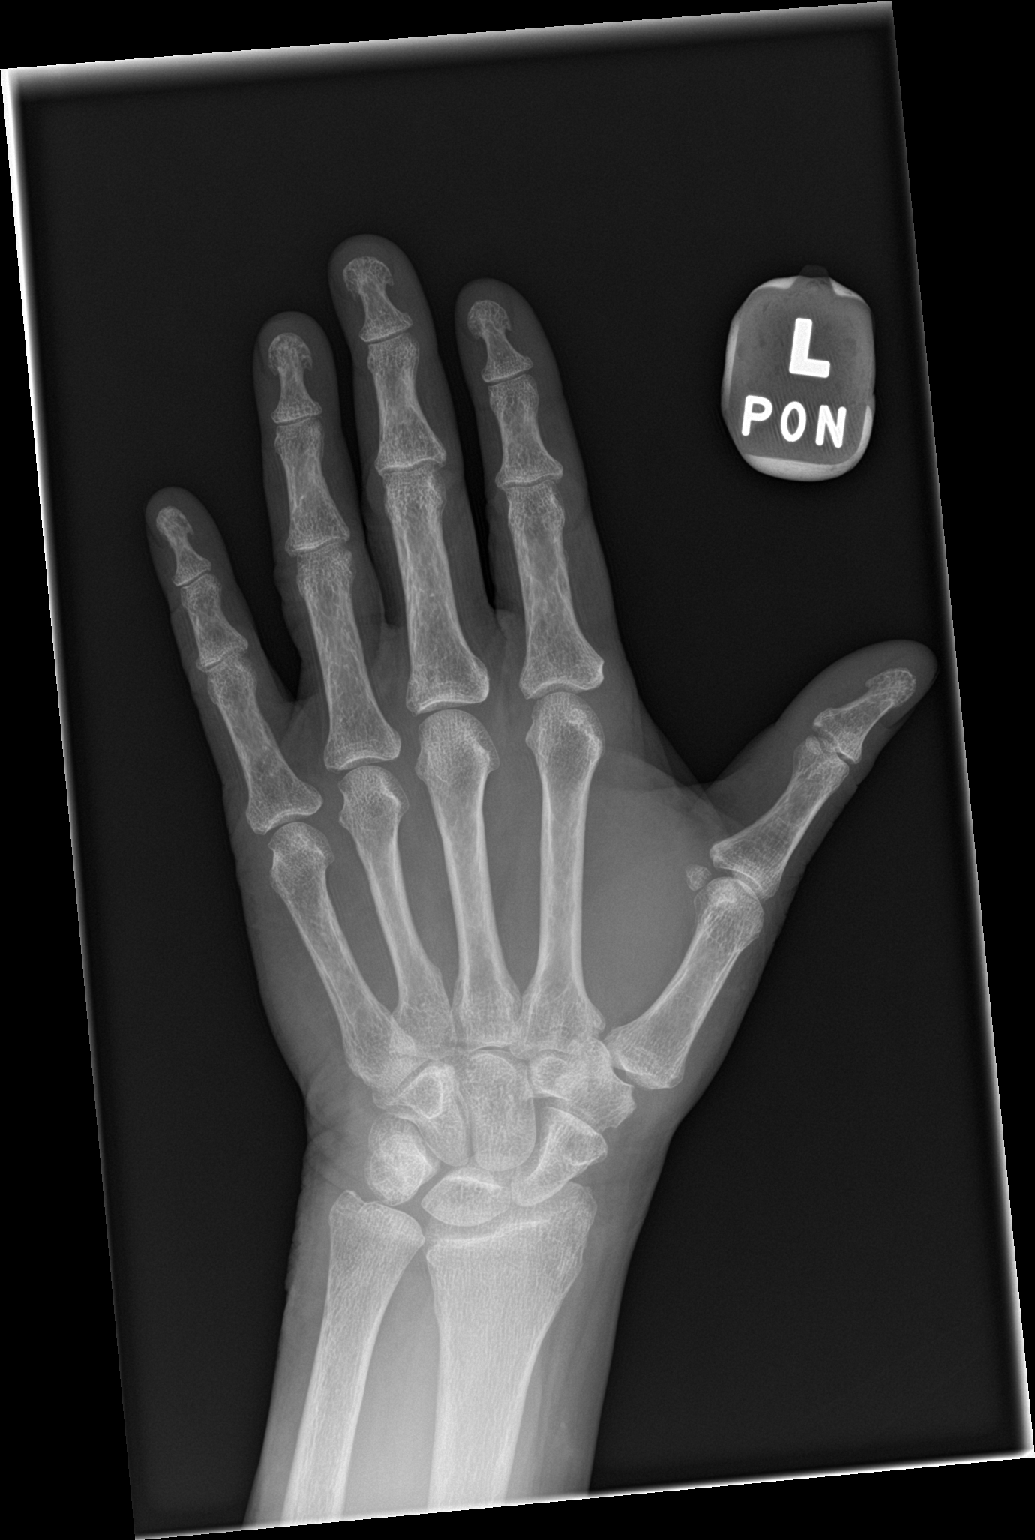

[hand obl]
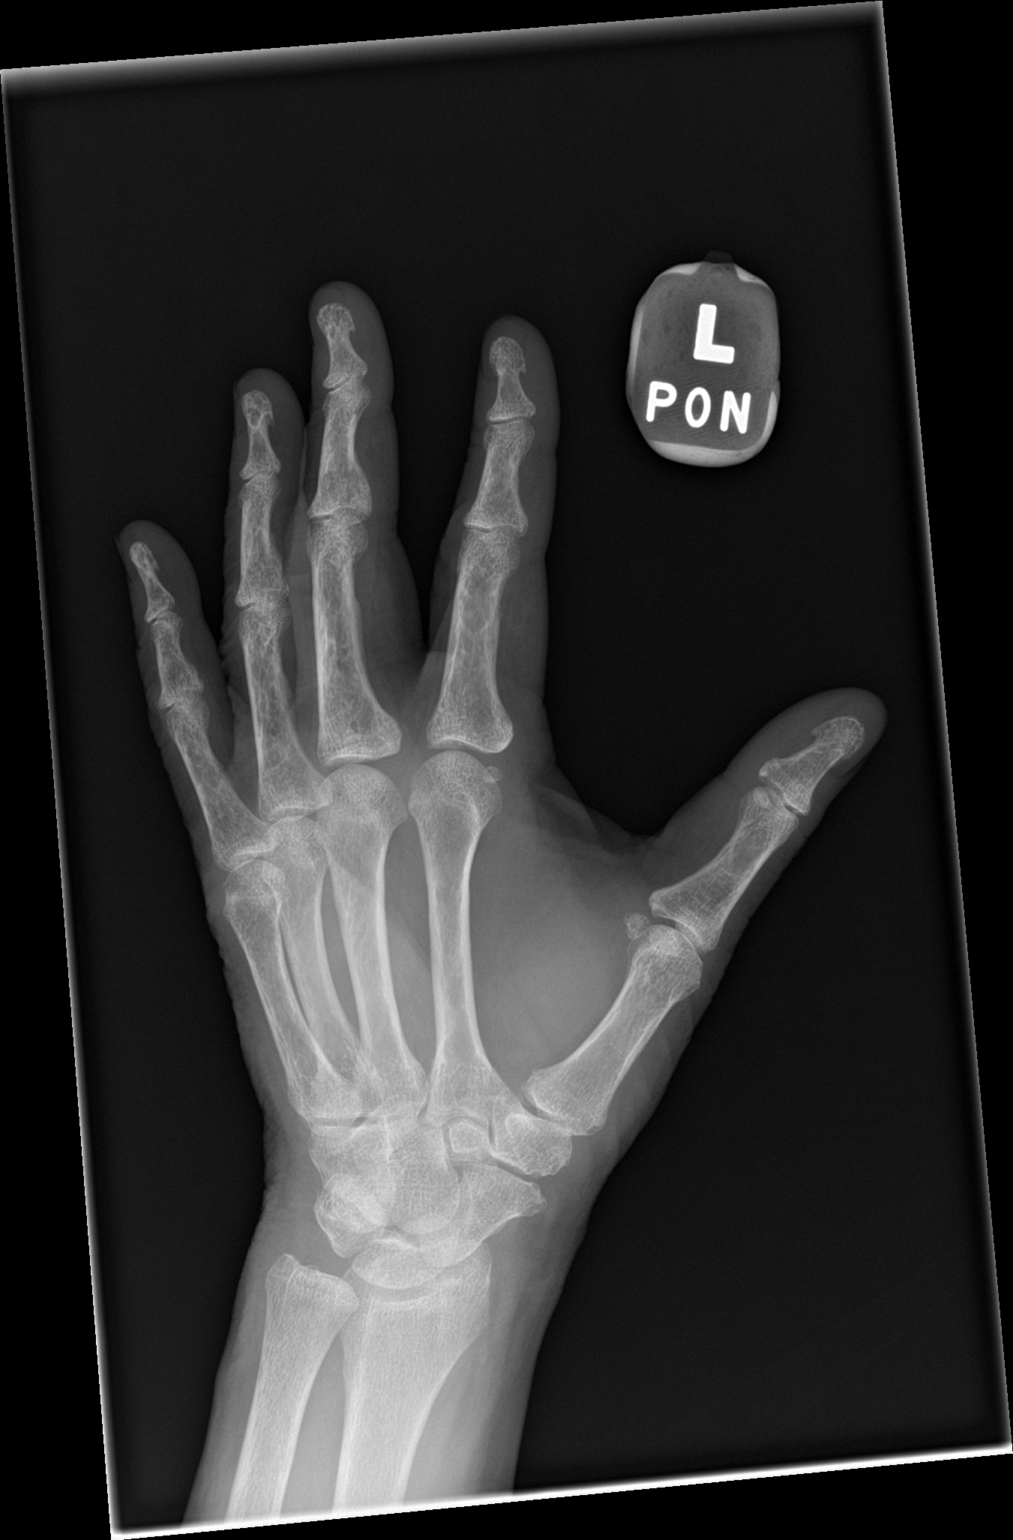

[hand lat]
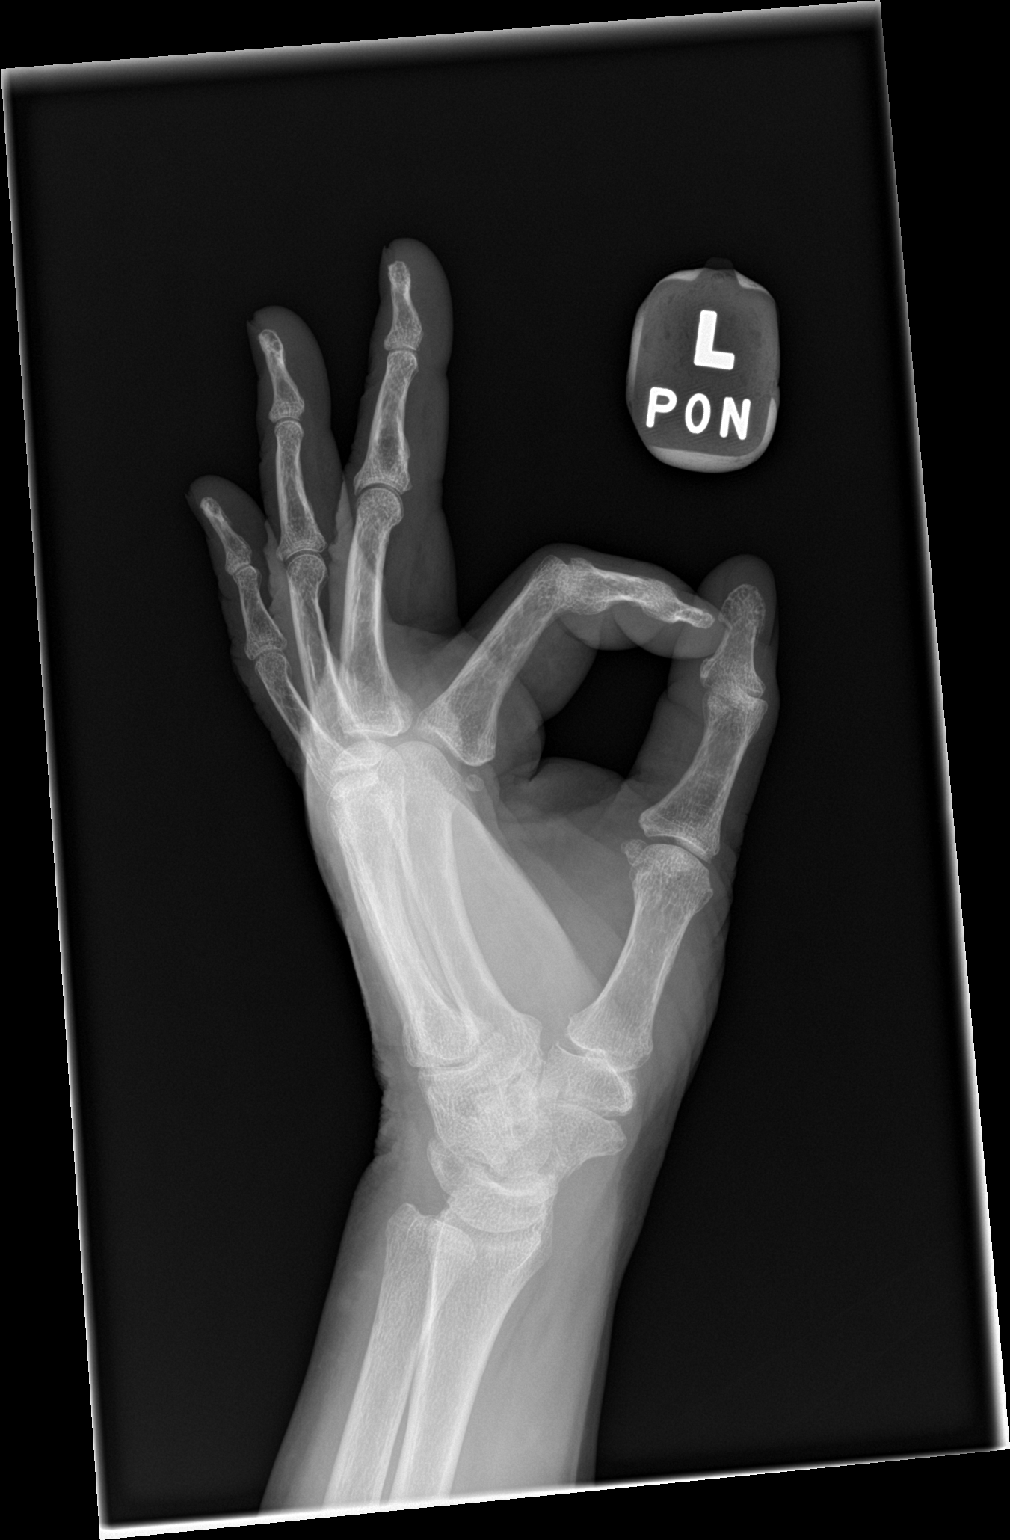

[3 of 3 positions shown; findings below may reference images not displayed]

FINDINGS: There is no evidence of fracture or dislocation. Apparent mild
osteopenia. Soft tissues are unremarkable.
IMPRESSION: No acute fracture or dislocation identified about the left hand.

Apparent osteopenia.
